# Patient Record
Sex: Female | Born: 1935 | Race: White | Hispanic: No | Marital: Married | Smoking: Never smoker
Health system: Southern US, Community
[De-identification: ages and names within clinical notes are randomized; demographics above are authoritative.]

## PROBLEM LIST (undated history)

## (undated) DIAGNOSIS — J449 Chronic obstructive pulmonary disease, unspecified: Secondary | ICD-10-CM

## (undated) DIAGNOSIS — F0390 Unspecified dementia without behavioral disturbance: Secondary | ICD-10-CM

## (undated) DIAGNOSIS — C801 Malignant (primary) neoplasm, unspecified: Secondary | ICD-10-CM

## (undated) HISTORY — PX: MASTECTOMY: SHX3

## (undated) HISTORY — PX: SHOULDER SURGERY: SHX246

---

## 1998-04-26 ENCOUNTER — Inpatient Hospital Stay (HOSPITAL_COMMUNITY): Admission: EM | Admit: 1998-04-26 | Discharge: 1998-04-29 | Payer: Self-pay | Admitting: Orthopedic Surgery

## 1998-04-26 ENCOUNTER — Encounter: Payer: Self-pay | Admitting: Orthopedic Surgery

## 2015-09-07 ENCOUNTER — Inpatient Hospital Stay
Admission: EM | Admit: 2015-09-07 | Discharge: 2015-09-10 | DRG: 871 | Disposition: A | Discharge status: Home / Self Care | Payer: MEDICARE | Attending: Internal Medicine | Admitting: Internal Medicine

## 2015-09-07 ENCOUNTER — Emergency Department: Payer: MEDICARE

## 2015-09-07 ENCOUNTER — Other Ambulatory Visit: Payer: Self-pay

## 2015-09-07 ENCOUNTER — Encounter: Payer: Self-pay | Admitting: Emergency Medicine

## 2015-09-07 DIAGNOSIS — J189 Pneumonia, unspecified organism: Secondary | ICD-10-CM

## 2015-09-07 DIAGNOSIS — Z79899 Other long term (current) drug therapy: Secondary | ICD-10-CM

## 2015-09-07 DIAGNOSIS — Z9889 Other specified postprocedural states: Secondary | ICD-10-CM | POA: Diagnosis not present

## 2015-09-07 DIAGNOSIS — Z885 Allergy status to narcotic agent status: Secondary | ICD-10-CM

## 2015-09-07 DIAGNOSIS — R059 Cough, unspecified: Secondary | ICD-10-CM

## 2015-09-07 DIAGNOSIS — Z853 Personal history of malignant neoplasm of breast: Secondary | ICD-10-CM

## 2015-09-07 DIAGNOSIS — J9621 Acute and chronic respiratory failure with hypoxia: Secondary | ICD-10-CM | POA: Diagnosis present

## 2015-09-07 DIAGNOSIS — Z66 Do not resuscitate: Secondary | ICD-10-CM | POA: Diagnosis present

## 2015-09-07 DIAGNOSIS — F0390 Unspecified dementia without behavioral disturbance: Secondary | ICD-10-CM | POA: Diagnosis present

## 2015-09-07 DIAGNOSIS — Z888 Allergy status to other drugs, medicaments and biological substances status: Secondary | ICD-10-CM

## 2015-09-07 DIAGNOSIS — R0902 Hypoxemia: Secondary | ICD-10-CM

## 2015-09-07 DIAGNOSIS — A419 Sepsis, unspecified organism: Secondary | ICD-10-CM | POA: Diagnosis not present

## 2015-09-07 DIAGNOSIS — R000 Tachycardia, unspecified: Secondary | ICD-10-CM | POA: Diagnosis present

## 2015-09-07 DIAGNOSIS — J440 Chronic obstructive pulmonary disease with acute lower respiratory infection: Secondary | ICD-10-CM | POA: Diagnosis present

## 2015-09-07 DIAGNOSIS — Z8249 Family history of ischemic heart disease and other diseases of the circulatory system: Secondary | ICD-10-CM

## 2015-09-07 DIAGNOSIS — Z9010 Acquired absence of unspecified breast and nipple: Secondary | ICD-10-CM

## 2015-09-07 DIAGNOSIS — R05 Cough: Secondary | ICD-10-CM

## 2015-09-07 HISTORY — DX: Unspecified dementia, unspecified severity, without behavioral disturbance, psychotic disturbance, mood disturbance, and anxiety: F03.90

## 2015-09-07 HISTORY — DX: Chronic obstructive pulmonary disease, unspecified: J44.9

## 2015-09-07 HISTORY — DX: Malignant (primary) neoplasm, unspecified: C80.1

## 2015-09-07 LAB — HCG, QUANTITATIVE, PREGNANCY

## 2015-09-07 LAB — URINALYSIS COMPLETE WITH MICROSCOPIC (ARMC ONLY)
BILIRUBIN URINE: NEGATIVE
Bacteria, UA: NONE SEEN
GLUCOSE, UA: NEGATIVE mg/dL
HGB URINE DIPSTICK: NEGATIVE
KETONES UR: NEGATIVE mg/dL
LEUKOCYTES UA: NEGATIVE
Nitrite: NEGATIVE
Protein, ur: NEGATIVE mg/dL
Specific Gravity, Urine: 1.018 (ref 1.005–1.030)
pH: 6 (ref 5.0–8.0)

## 2015-09-07 LAB — COMPREHENSIVE METABOLIC PANEL
ALK PHOS: 64 U/L (ref 38–126)
ALT: 27 U/L (ref 14–54)
AST: 40 U/L (ref 15–41)
Albumin: 3.5 g/dL (ref 3.5–5.0)
Anion gap: 8 (ref 5–15)
BUN: 14 mg/dL (ref 6–20)
CHLORIDE: 96 mmol/L — AB (ref 101–111)
CO2: 30 mmol/L (ref 22–32)
CREATININE: 0.84 mg/dL (ref 0.44–1.00)
Calcium: 9.6 mg/dL (ref 8.9–10.3)
GFR calc Af Amer: >60 mL/min (ref >60)
GFR, EST NON AFRICAN AMERICAN: 58 mL/min — AB (ref >60)
Glucose, Bld: 122 mg/dL — ABNORMAL HIGH (ref 65–99)
Potassium: 4.1 mmol/L (ref 3.5–5.1)
Sodium: 134 mmol/L — ABNORMAL LOW (ref 135–145)
Total Bilirubin: 1.1 mg/dL (ref 0.3–1.2)
Total Protein: 7.6 g/dL (ref 6.5–8.1)

## 2015-09-07 LAB — CBC WITH DIFFERENTIAL/PLATELET
BASOS ABS: 0.1 10*3/uL (ref 0–0.1)
EOS ABS: 0 10*3/uL (ref 0–0.7)
Eosinophils Relative: 0 %
HCT: 39.5 % (ref 35.0–47.0)
HEMOGLOBIN: 13.2 g/dL (ref 12.0–16.0)
Lymphocytes Relative: 8 %
Lymphs Abs: 1.2 10*3/uL (ref 1.0–3.6)
MCH: 30.6 pg (ref 26.0–34.0)
MCHC: 33.4 g/dL (ref 32.0–36.0)
MCV: 91.9 fL (ref 80.0–100.0)
MONO ABS: 1.2 10*3/uL — AB (ref 0.2–0.9)
NEUTROS ABS: 12 10*3/uL — AB (ref 1.4–6.5)
PLATELETS: 197 10*3/uL (ref 150–440)
RBC: 4.3 MIL/uL (ref 3.80–5.20)
RDW: 14.6 % — AB (ref 11.5–14.5)
WBC: 14.5 10*3/uL — ABNORMAL HIGH (ref 3.6–11.0)

## 2015-09-07 LAB — LACTIC ACID, PLASMA: LACTIC ACID, VENOUS: 1.2 mmol/L (ref 0.5–2.0)

## 2015-09-07 MED ORDER — ENOXAPARIN SODIUM 40 MG/0.4ML ~~LOC~~ SOLN
40.0000 mg | SUBCUTANEOUS | Status: DC
  Administered 2015-09-07 – 2015-09-09 (×3): 40 mg via SUBCUTANEOUS
  Filled 2015-09-07 (×3): qty 0.4

## 2015-09-07 MED ORDER — CEFTRIAXONE SODIUM 1 G IJ SOLR
1.0000 g | Freq: Once | INTRAMUSCULAR | Status: AC
  Administered 2015-09-07: 1 g via INTRAVENOUS
  Filled 2015-09-07: qty 10

## 2015-09-07 MED ORDER — PANTOPRAZOLE SODIUM 40 MG PO TBEC
40.0000 mg | DELAYED_RELEASE_TABLET | ORAL | Status: DC
  Administered 2015-09-08 – 2015-09-09 (×2): 40 mg via ORAL
  Filled 2015-09-07 (×2): qty 1

## 2015-09-07 MED ORDER — ALBUTEROL SULFATE (2.5 MG/3ML) 0.083% IN NEBU
2.5000 mg | INHALATION_SOLUTION | Freq: Four times a day (QID) | RESPIRATORY_TRACT | Status: DC
Start: 2015-09-07 — End: 2015-09-10
  Administered 2015-09-07 – 2015-09-09 (×9): 2.5 mg via RESPIRATORY_TRACT
  Filled 2015-09-07 (×11): qty 3

## 2015-09-07 MED ORDER — SODIUM CHLORIDE 0.9 % IV SOLN
INTRAVENOUS | Status: DC
  Administered 2015-09-07 – 2015-09-08 (×2): via INTRAVENOUS

## 2015-09-07 MED ORDER — ALBUTEROL SULFATE (2.5 MG/3ML) 0.083% IN NEBU
2.5000 mg | INHALATION_SOLUTION | RESPIRATORY_TRACT | Status: DC | PRN
  Administered 2015-09-07 – 2015-09-09 (×5): 2.5 mg via RESPIRATORY_TRACT
  Filled 2015-09-07 (×6): qty 3

## 2015-09-07 MED ORDER — TRAMADOL HCL 50 MG PO TABS
25.0000 mg | ORAL_TABLET | Freq: Every evening | ORAL | Status: DC | PRN
  Administered 2015-09-07: 25 mg via ORAL
  Filled 2015-09-07: qty 1

## 2015-09-07 MED ORDER — ONDANSETRON HCL 4 MG PO TABS: 4.0000 mg | ORAL_TABLET | Freq: Three times a day (TID) | ORAL | Status: DC | PRN

## 2015-09-07 MED ORDER — DEXTROSE 5 % IV SOLN
500.0000 mg | Freq: Once | INTRAVENOUS | Status: AC
  Administered 2015-09-07: 500 mg via INTRAVENOUS
  Filled 2015-09-07: qty 500

## 2015-09-07 MED ORDER — CEFTRIAXONE SODIUM 1 G IJ SOLR
1.0000 g | INTRAMUSCULAR | Status: DC
  Administered 2015-09-08 – 2015-09-09 (×2): 1 g via INTRAVENOUS
  Filled 2015-09-07 (×3): qty 10

## 2015-09-07 MED ORDER — ACETAMINOPHEN 500 MG PO TABS
1000.0000 mg | ORAL_TABLET | Freq: Four times a day (QID) | ORAL | Status: DC | PRN
  Administered 2015-09-08 – 2015-09-09 (×3): 1000 mg via ORAL
  Filled 2015-09-07 (×3): qty 2

## 2015-09-07 MED ORDER — SODIUM CHLORIDE 0.9 % IV BOLUS (SEPSIS)
1000.0000 mL | Freq: Once | INTRAVENOUS | Status: AC
  Administered 2015-09-07: 1000 mL via INTRAVENOUS

## 2015-09-07 MED ORDER — HYDROXYZINE HCL 10 MG PO TABS
25.0000 mg | ORAL_TABLET | ORAL | Status: DC
  Administered 2015-09-07 – 2015-09-09 (×12): 25 mg via ORAL
  Filled 2015-09-07: qty 1
  Filled 2015-09-07 (×11): qty 3

## 2015-09-07 MED ORDER — DEXTROSE 5 % IV SOLN
500.0000 mg | INTRAVENOUS | Status: DC
  Administered 2015-09-08 – 2015-09-09 (×2): 500 mg via INTRAVENOUS
  Filled 2015-09-07 (×3): qty 500

## 2015-09-07 MED ORDER — METHENAMINE MANDELATE 1 G PO TABS
1000.0000 mg | ORAL_TABLET | Freq: Every day | ORAL | Status: DC
  Administered 2015-09-07 – 2015-09-09 (×3): 1000 mg via ORAL
  Filled 2015-09-07 (×5): qty 1

## 2015-09-07 MED ORDER — ALPRAZOLAM 0.5 MG PO TABS
0.5000 mg | ORAL_TABLET | Freq: Every day | ORAL | Status: DC
  Administered 2015-09-07 – 2015-09-09 (×2): 0.5 mg via ORAL
  Filled 2015-09-07 (×2): qty 1

## 2015-09-07 NOTE — ED Notes (Signed)
[facility] radiology called to [facility] about chest xray; States that they cannot send over fax of impression. Unsure why it is not showing up in computer.  Staff read impression to this RN,  Impression: Bibasilar atelectasis possible pneumonia. Chronic interstitial lung disease Read by Dr Claudie Revering

## 2015-09-07 NOTE — ED Notes (Signed)
Patient presents to the ED with fever and increased weakness.  Patient has had cold symptoms since Friday per patient's daughter.  Patient normally can get up to bedside commode but patient is unable to move herself this morning.  Patient is alert and able to answer some questions.

## 2015-09-07 NOTE — ED Notes (Signed)
Spoke with MD regarding ordered fluid bolus. Inquired if more NS was needed than 109ml per patient weight of 54kg. No further orders from MD at this time.

## 2015-09-07 NOTE — ED Notes (Signed)
Lab called to inquire if patient can be credited hcg lab test since it was entered in error per protocol for another Therapist, sports.

## 2015-09-07 NOTE — ED Provider Notes (Signed)
[facility] Emergency Department Provider Note   ____________________________________________  Time seen: Approximately 8:29 AM  I have reviewed the triage vital signs and the nursing notes.   HISTORY  Chief Complaint Weakness; Code Sepsis; and Shortness of Breath    HPI Susan Alexander is a 82 y.o. female who complains of green sputum and productive cough for 3 days. Today patient began running a fever and was very weak and had trouble getting out of bed by herself. Patient has a history of mild COPD but no hypertension diabetes congestive heart failure or heart disease. Patient lives at home has 24-hour care at home from the family and caregivers. Patient has no other associated symptoms and nothing seems to make her symptoms any better or worse   Past Medical History  Diagnosis Date  . COPD (chronic obstructive pulmonary disease) ([place])   . Dementia   . Cancer [facility])     breast cancer    There are no active problems to display for this patient.   Past Surgical History  Procedure Laterality Date  . Mastectomy Left   . Shoulder surgery      Current Outpatient Rx  Name  Route  Sig  Dispense  Refill  . acetaminophen (TYLENOL) 500 MG tablet   Oral   Take 1,000 mg by mouth every 6 (six) hours as needed for mild pain or moderate pain.         Marland Kitchen albuterol (PROVENTIL) (2.5 MG/3ML) 0.083% nebulizer solution   Nebulization   Take 2.5 mg by nebulization every 4 (four) hours as needed for wheezing or shortness of breath.         . ALPRAZolam (XANAX) 0.5 MG tablet   Oral   Take 0.5 mg by mouth at bedtime.         . hydrOXYzine (ATARAX/VISTARIL) 25 MG tablet   Oral   Take 25 mg by mouth every 4 (four) hours.          . methenamine (MANDELAMINE) 1 g tablet   Oral   Take 1,000 mg by mouth daily.         . ondansetron (ZOFRAN) 4 MG tablet   Oral   Take 4 mg by mouth every 8 (eight) hours as needed for nausea or vomiting.         .  pantoprazole (PROTONIX) 40 MG tablet   Oral   Take 40 mg by mouth every morning.         . traMADol (ULTRAM) 50 MG tablet   Oral   Take 25 mg by mouth at bedtime as needed for moderate pain or severe pain.           Allergies Macrodantin and Morphine and related  No family history on file.  Social History Social History  Substance Use Topics  . Smoking status: Never Smoker   . Smokeless tobacco: None  . Alcohol Use: No    Review of Systems Constitutional: fever Eyes: No visual changes. ENT: No sore throat. Cardiovascular: Denies chest pain. Respiratory:shortness of breath. Gastrointestinal: No abdominal pain.  No nausea, no vomiting.  No diarrhea.  No constipation. Genitourinary: Negative for dysuria. Musculoskeletal: Negative for back pain. Skin: Negative for rash. Neurological: Negative for headaches, focal weakness or numbness.  10-point ROS otherwise negative.  ____________________________________________   PHYSICAL EXAM:  VITAL SIGNS: ED Triage Vitals  Enc Vitals Group     BP 09/07/15 0804 100/55 mmHg     Pulse Rate 09/07/15 0804  88     Resp 09/07/15 0823 18     Temp 09/07/15 0804 100.5 F (38.1 C)     Temp Source 09/07/15 0804 Rectal     SpO2 09/07/15 0804 80 %     Weight 09/07/15 0804 114 lb (51.71 kg)     Height 09/07/15 0804 5\' 4"  (1.626 m)     Head Cir --      Peak Flow --      Pain Score --      Pain Loc --      Pain Edu? --      Excl. in [place]? --     Constitutional: Alert and oriented. Patient looks exhausted is laying in bed over to the side of her mouth is open and her face is lacking at this point. If you speak to her she sits up and looks much more normal but otherwise she looks exhausted. Eyes: Conjunctivae are normal. PERRL. EOMI. Head: Atraumatic. Nose: No congestion/rhinnorhea. Mouth/Throat: Mucous membranes are moist.  Oropharynx non-erythematous. Neck: No stridor.  Cardiovascular: Normal rate, regular rhythm. Grossly normal  heart sounds.  Good peripheral circulation. Respiratory: Normal respiratory effort.  No retractions. Lungs crackles especially in the right base Gastrointestinal: Soft and nontender. No distention. No abdominal bruits. No CVA tenderness. Musculoskeletal: No lower extremity tenderness nor edema.  No joint effusions. Neurologic:  Normal speech and language. No gross focal neurologic deficits are appreciated. Skin:  Skin is warm, dry and intact. No rash noted. Psychiatric: Mood and affect are normal. Speech and behavior are normal.  ____________________________________________   LABS (all labs ordered are listed, but only abnormal results are displayed)  Labs Reviewed  COMPREHENSIVE METABOLIC PANEL - Abnormal; Notable for the following:    Sodium 134 (*)    Chloride 96 (*)    Glucose, Bld 122 (*)    GFR calc non Af Amer 58 (*)    All other components within normal limits  CBC WITH DIFFERENTIAL/PLATELET - Abnormal; Notable for the following:    WBC 14.5 (*)    RDW 14.6 (*)    Neutro Abs 12.0 (*)    Monocytes Absolute 1.2 (*)    All other components within normal limits  URINALYSIS COMPLETEWITH MICROSCOPIC ([facility] ONLY) - Abnormal; Notable for the following:    Color, Urine YELLOW (*)    APPearance CLEAR (*)    Squamous Epithelial / LPF 0-5 (*)    All other components within normal limits  CULTURE, BLOOD (ROUTINE X 2)  CULTURE, BLOOD (ROUTINE X 2)  URINE CULTURE  HCG, QUANTITATIVE, PREGNANCY  LACTIC ACID, PLASMA   ____________________________________________  EKG  EKG read and interpreted by me shows sinus rhythm rate of 89 left axis left bundle-branch block no other changes are appreciated but no old EKGs are available. ____________________________________________  RADIOLOGY  Chest x-ray report is not coming up on the computer however the radiologist I am informed that it is atelectasis or infiltrate ____________________________________________   PROCEDURES  CRITICAL  CARE Performed by: Nena Polio   Total critical care time:15 minutes  Critical care time was exclusive of separately billable procedures and treating other patients.  Critical care was necessary to treat or prevent imminent or life-threatening deterioration.  Critical care was time spent personally by me on the following activities: development of treatment plan with patient and/or surrogate as well as nursing, discussions with consultants, evaluation of patient's response to treatment, examination of patient, obtaining history from patient or surrogate, ordering and performing treatments  and interventions, ordering and review of laboratory studies, ordering and review of radiographic studies, pulse oximetry and re-evaluation of patient's condition.  ____________________________________________   INITIAL IMPRESSION / ASSESSMENT AND PLAN / ED COURSE  Pertinent labs & imaging results that were available during my care of the patient were reviewed by me and considered in my medical decision making (see chart for details).   ____________________________________________   FINAL CLINICAL IMPRESSION(S) / ED DIAGNOSES  Final diagnoses:  Hypoxia  Community acquired pneumonia  Sepsis, due to unspecified organism [facility])      NEW MEDICATIONS STARTED DURING THIS VISIT:  New Prescriptions   No medications on file     Note:  This document was prepared using Dragon voice recognition software and may include unintentional dictation errors.    Nena Polio, MD 09/07/15 1520

## 2015-09-07 NOTE — ED Notes (Signed)
Family at bedside. Denies any CHF diagnosis or sx.

## 2015-09-07 NOTE — ED Notes (Signed)
MD at bedside. 

## 2015-09-07 NOTE — ED Notes (Addendum)
Xray department called to inquire about chest xray result, told to call [facility] Radiology for questions.

## 2015-09-07 NOTE — ED Notes (Signed)
Pt updated on xray results; informed that patient will admitted and admission MD will be in to see patient and family. Pt alert and oriented X4, active, cooperative, pt in NAD. RR even and unlabored, color WNL.

## 2015-09-07 NOTE — ED Notes (Signed)
Report to Alicia, RN.

## 2015-09-07 NOTE — ED Notes (Signed)
Pt placed on 4L [place] and is now at 99% on oxygen. Pt does not usually wear oxygen at home. Pt unable to stand due to weakness; this is acute since last night. Pt c/o cough X 3 days. Pt daughter has been treating with OTC mucinex.

## 2015-09-07 NOTE — H&P (Signed)
[facility] [place] NAME: Susan Alexander    MR#:  [id number]  DATE OF BIRTH:  [birth year]  DATE OF ADMISSION:  09/07/2015  PRIMARY CARE PHYSICIAN: Rusty Aus, MD   REQUESTING/REFERRING PHYSICIAN: dr Cinda Quest  CHIEF COMPLAINT:  SOB, cough and fever  HISTORY OF PRESENT ILLNESS:  Susan Alexander  is a 82 y.o. female with a known history of Dementia, history of breast cancer and history of COPD comes into the emergency room accompanied by her daughter with the above chief complaint. Patient is very weak fatigued and unable to give much review of systems or history. Most of the information is obtained from daughter who is at bedside. Patient has been having cough with productive green thick mucoid sputum for last couple days was found to have low-grade fever last night and appears very weak short of breath. Daughter gave some breathing treatment however patient wasn't able to do her routine chores by herself which she normally does. She was brought to the emergency room was found to have pneumonia bilateral more on the right than left.  Patient is being admitted with sepsis and she presented with fever of 100.2 tachycardia elevated white count and chest x-ray suggestive of pneumonia.  She received IV Rocephin and Zithromax in the emergency room.  PAST MEDICAL HISTORY:   Past Medical History  Diagnosis Date  . COPD (chronic obstructive pulmonary disease) ([place])   . Dementia   . Cancer [facility])     breast cancer    PAST SURGICAL HISTOIRY:   Past Surgical History  Procedure Laterality Date  . Mastectomy Left   . Shoulder surgery      SOCIAL HISTORY:   Social History  Substance Use Topics  . Smoking status: Never Smoker   . Smokeless tobacco: Not on file  . Alcohol Use: No    FAMILY HISTORY:  HTN  DRUG ALLERGIES:   Allergies  Allergen Reactions  . Macrodantin [Nitrofurantoin] Other (See Comments)    Unknown reaction.  .  Morphine And Related Rash    REVIEW OF SYSTEMS:  Review of Systems  Unable to perform ROS: acuity of condition     MEDICATIONS AT HOME:   Prior to Admission medications   Medication Sig Start Date End Date Taking? Authorizing Provider  acetaminophen (TYLENOL) 500 MG tablet Take 1,000 mg by mouth every 6 (six) hours as needed for mild pain or moderate pain.   Yes Historical Provider, MD  albuterol (PROVENTIL) (2.5 MG/3ML) 0.083% nebulizer solution Take 2.5 mg by nebulization every 4 (four) hours as needed for wheezing or shortness of breath.   Yes Historical Provider, MD  ALPRAZolam Duanne Moron) 0.5 MG tablet Take 0.5 mg by mouth at bedtime.   Yes Historical Provider, MD  hydrOXYzine (ATARAX/VISTARIL) 25 MG tablet Take 25 mg by mouth every 4 (four) hours.    Yes Historical Provider, MD  methenamine (MANDELAMINE) 1 g tablet Take 1,000 mg by mouth daily.   Yes Historical Provider, MD  ondansetron (ZOFRAN) 4 MG tablet Take 4 mg by mouth every 8 (eight) hours as needed for nausea or vomiting.   Yes Historical Provider, MD  pantoprazole (PROTONIX) 40 MG tablet Take 40 mg by mouth every morning.   Yes Historical Provider, MD  traMADol (ULTRAM) 50 MG tablet Take 25 mg by mouth at bedtime as needed for moderate pain or severe pain.   Yes Historical Provider, MD      VITAL SIGNS:  Blood pressure  112/89, pulse 84, temperature 100.5 F (38.1 C), temperature source Rectal, resp. rate 24, height 5\' 4"  (1.626 m), weight 54.2 kg (119 lb 7.8 oz), SpO2 98 %.  PHYSICAL EXAMINATION:  GENERAL:  82 y.o.-year-old patient lying in the bed with no acute distress. weak EYES: Pupils equal, round, reactive to light and accommodation. No scleral icterus. Extraocular muscles intact.  HEENT: Head atraumatic, normocephalic. Oropharynx and nasopharynx clear.  NECK:  Supple, no jugular venous distention. No thyroid enlargement, no tenderness.  LUNGS: Normal breath sounds bilaterally, no wheezing, bilateral rales,no rhonchi  or crepitation. No use of accessory muscles of respiration.  CARDIOVASCULAR: S1, S2 normal. No murmurs, rubs, or gallops. tachycardia ABDOMEN: Soft, nontender, nondistended. Bowel sounds present. No organomegaly or mass.  EXTREMITIES: No pedal edema, cyanosis, or clubbing.  NEUROLOGIC: Cranial nerves II through XII are intact. Muscle strength 4/5 in all extremities. Sensation intact. Gait not checked. Very weak PSYCHIATRIC: The patient is alert but confused SKIN: No obvious rash, lesion, or ulcer.   LABORATORY PANEL:   CBC  Recent Labs Lab 09/07/15 0807  WBC 14.5*  HGB 13.2  HCT 39.5  PLT 197   ------------------------------------------------------------------------------------------------------------------  Chemistries   Recent Labs Lab 09/07/15 0807  NA 134*  K 4.1  CL 96*  CO2 30  GLUCOSE 122*  BUN 14  CREATININE 0.84  CALCIUM 9.6  AST 40  ALT 27  ALKPHOS 64  BILITOT 1.1   IMPRESSION AND PLAN:   Susan Alexander  is a 82 y.o. female with a known history of Dementia, history of breast cancer and history of COPD comes into the emergency room accompanied by her daughter with the above chief complaint. Patient is very weak fatigued and unable to give much review of systems or history. Most of the information is obtained from daughter who is at bedside. Patient has been having cough with productive green thick mucoid sputum for last couple days was found to have low-grade fever last night and appears very weak short of breath.  1. sepsis due to bilateral pneumonia right more than left, community-acquired -Admit to medical floor -IV Rocephin and Zithromax. -Follow blood culture and WBC count -Incentive spirometer and breathing treatment around the clock  2. Leukocytosis due to 1  3. Mild dementia -Patient has 24x 7 caregivers at home  4. Gen weakness _PT to see pt -Cm for d/c planing  All the records are reviewed and case discussed with ED provider. Management  plans discussed with the patient, family and they are in agreement.  CODE STATUS: DNR (per daughter 30)  TOTAL TIME TAKING CARE OF THIS PATIENT: 50 minutes.    Dutch Ing M.D on 09/07/2015 at 12:05 PM  Between 7am to 6pm - Pager - [phone]  After 6pm go to [email or web address] - password [email or web address] [place]  Office  [phone]  CC: Primary care physician; Rusty Aus, MD

## 2015-09-08 LAB — URINE CULTURE: CULTURE: NO GROWTH

## 2015-09-08 MED ORDER — MORPHINE SULFATE (PF) 2 MG/ML IV SOLN
0.5000 mg | Freq: Once | INTRAVENOUS | Status: AC
  Administered 2015-09-08: 0.5 mg via INTRAVENOUS
  Filled 2015-09-08: qty 1

## 2015-09-08 MED ORDER — TRAMADOL HCL 50 MG PO TABS
25.0000 mg | ORAL_TABLET | Freq: Once | ORAL | Status: AC
  Administered 2015-09-08: 07:00:00 25 mg via ORAL
  Filled 2015-09-08: qty 1

## 2015-09-08 MED ORDER — LORAZEPAM 2 MG/ML IJ SOLN
0.5000 mg | Freq: Once | INTRAMUSCULAR | Status: AC
  Administered 2015-09-08: 0.5 mg via INTRAVENOUS
  Filled 2015-09-08: qty 1

## 2015-09-08 MED ORDER — METOPROLOL TARTRATE 5 MG/5ML IV SOLN
5.0000 mg | Freq: Once | INTRAVENOUS | Status: AC
  Administered 2015-09-08: 5 mg via INTRAVENOUS
  Filled 2015-09-08: qty 5

## 2015-09-08 MED ORDER — TRAMADOL HCL 50 MG PO TABS
25.0000 mg | ORAL_TABLET | Freq: Three times a day (TID) | ORAL | Status: DC | PRN
  Administered 2015-09-09: 25 mg via ORAL
  Filled 2015-09-08: qty 1

## 2015-09-08 NOTE — Evaluation (Signed)
Physical Therapy Evaluation Patient Details Name: Susan Alexander MRN: [id number] DOB: 1918-04-19 Today's Date: 09/08/2015   History of Present Illness  Patient is being admitted with sepsis and she presented with fever of 100.2 tachycardia elevated white count and chest x-ray suggestive of pneumonia.  Clinical Impression  Patient presents with decreased UE and LE strength, decreased bed mobility with max assist x 1 supine to sit and max assist x 2 sit to supine. She needs max assist x 2 for sit to stand with RW and is able to stand with mod assist x 2 for less than 1 minute. She is not able to take any steps. Patient will benefit from skilled PT to return to her PLOF.     Follow Up Recommendations SNF    Equipment Recommendations  Rolling walker with 5" wheels    Recommendations for Other Services       Precautions / Restrictions Precautions Precautions: Fall Restrictions Weight Bearing Restrictions: No      Mobility  Bed Mobility Overal bed mobility: +2 for physical assistance                Transfers Overall transfer level: Needs assistance Equipment used: Rolling walker (2 wheeled) Transfers: Sit to/from Stand Sit to Stand: Max assist;+2 physical assistance            Ambulation/Gait Ambulation/Gait assistance:  (unable)              Stairs            Wheelchair Mobility    Modified Rankin (Stroke Patients Only)       Balance                                             Pertinent Vitals/Pain Pain Assessment: No/denies pain    Home Living Family/patient expects to be discharged to:: Private residence Living Arrangements: Children Available Help at Discharge: Family Type of Home: House Home Access: Ramped entrance     Home Layout: One level Home Equipment: Environmental consultant - 2 wheels;Bedside commode;Shower seat;Hospital bed;Wheelchair - manual      Prior Function Level of Independence: Needs assistance   Gait /  Transfers Assistance Needed: assist for transfers and gait  ADL's / Homemaking Assistance Needed: assist for ADL's         Hand Dominance   Dominant Hand: Right    Extremity/Trunk Assessment   Upper Extremity Assessment: Generalized weakness;LUE deficits/detail       LUE Deficits / Details: 2/5 strength and 0-60 deg ROM left shoulder   Lower Extremity Assessment: Generalized weakness      Cervical / Trunk Assessment: Kyphotic  Communication      Cognition Arousal/Alertness: Awake/alert Behavior During Therapy: WFL for tasks assessed/performed Overall Cognitive Status: Within Functional Limits for tasks assessed                      General Comments      Exercises        Assessment/Plan    PT Assessment Patient needs continued PT services  PT Diagnosis Difficulty walking   PT Problem List Decreased strength;Decreased range of motion;Decreased activity tolerance;Decreased mobility  PT Treatment Interventions Gait training;Functional mobility training;Therapeutic activities;Therapeutic exercise   PT Goals (Current goals can be found in the Care Plan section) Acute Rehab PT Goals Patient Stated Goal: no goals stated Time For  Goal Achievement: 09/22/15 Potential to Achieve Goals: Fair    Frequency Min 2X/week   Barriers to discharge        Co-evaluation               End of Session Equipment Utilized During Treatment: Gait belt Activity Tolerance: Patient limited by fatigue;Patient limited by lethargy Patient left: in bed;with bed alarm set           Time: 1015-1045 PT Time Calculation (min) (ACUTE ONLY): 30 min   Charges:   PT Evaluation $PT Eval Moderate Complexity: 1 Procedure PT Treatments $Therapeutic Activity: 8-22 mins   PT G Codes:      Alanson Puls, PT, DPT  [place], Minette [place] S 09/08/2015, 11:36 AM

## 2015-09-08 NOTE — Progress Notes (Signed)
Pt lethargic since IV ativan, Morphine effective, less labored, resting quietly and comfortably daughter at bedside

## 2015-09-08 NOTE — Progress Notes (Signed)
[facility] at [place] NAME: Susan Alexander    MR#:  [id number]  DATE OF BIRTH:  09-30-1918  SUBJECTIVE:  CHIEF COMPLAINT:   Chief Complaint  Patient presents with  . Weakness  . Code Sepsis  . Shortness of Breath     Brought with yellow sputum.   As per daughter- slight better today.   Have pneumonia, and she is requiring home oxygen.  REVIEW OF SYSTEMS:  CONSTITUTIONAL: No fever, fatigue or weakness.  EYES: No blurred or double vision.  EARS, NOSE, AND THROAT: No tinnitus or ear pain.  RESPIRATORY: positive for cough, shortness of breath, no wheezing or hemoptysis.  CARDIOVASCULAR: No chest pain, orthopnea, edema.  GASTROINTESTINAL: No nausea, vomiting, diarrhea or abdominal pain.  GENITOURINARY: No dysuria, hematuria.  ENDOCRINE: No polyuria, nocturia,  HEMATOLOGY: No anemia, easy bruising or bleeding SKIN: No rash or lesion. MUSCULOSKELETAL: No joint pain or arthritis.   NEUROLOGIC: No tingling, numbness, weakness.  PSYCHIATRY: No anxiety or depression.   ROS  DRUG ALLERGIES:   Allergies  Allergen Reactions  . Macrodantin [Nitrofurantoin] Other (See Comments)    Unknown reaction.  . Morphine And Related Rash    VITALS:  Blood pressure 139/48, pulse 92, temperature 98.2 F (36.8 C), temperature source Oral, resp. rate 18, height 5\' 2"  (1.575 m), weight 58.741 kg (129 lb 8 oz), SpO2 99 %.  PHYSICAL EXAMINATION:  GENERAL:  82 y.o.-year-old patient lying in the bed with no acute distress.  EYES: Pupils equal, round, reactive to light and accommodation. No scleral icterus. Extraocular muscles intact.  HEENT: Head atraumatic, normocephalic. Oropharynx and nasopharynx clear.  NECK:  Supple, no jugular venous distention. No thyroid enlargement, no tenderness.  LUNGS: Normal breath sounds bilaterally, no wheezing, some crepitation. No use of accessory muscles of respiration.  CARDIOVASCULAR: S1, S2 normal. systolic murmurs, no  rubs, or gallops.  ABDOMEN: Soft, nontender, nondistended. Bowel sounds present. No organomegaly or mass.  EXTREMITIES: No pedal edema, cyanosis, or clubbing.  NEUROLOGIC: Cranial nerves II through XII are intact. Muscle strength 5/5 in all extremities. Sensation intact. Gait not checked.  PSYCHIATRIC: The patient is alert and oriented x 3.  SKIN: No obvious rash, lesion, or ulcer.   Physical Exam LABORATORY PANEL:   CBC  Recent Labs Lab 09/07/15 0807  WBC 14.5*  HGB 13.2  HCT 39.5  PLT 197   ------------------------------------------------------------------------------------------------------------------  Chemistries   Recent Labs Lab 09/07/15 0807  NA 134*  K 4.1  CL 96*  CO2 30  GLUCOSE 122*  BUN 14  CREATININE 0.84  CALCIUM 9.6  AST 40  ALT 27  ALKPHOS 64  BILITOT 1.1   ------------------------------------------------------------------------------------------------------------------  Cardiac Enzymes No results for input(s): TROPONINI in the last 168 hours. ------------------------------------------------------------------------------------------------------------------  RADIOLOGY:  No results found.  ASSESSMENT AND PLAN:   Active Problems:   Sepsis ([place])   Susan Alexander is a 82 y.o. female with a known history of Dementia, history of breast cancer and history of COPD comes into the emergency room accompanied by her daughter with the above chief complaint. Patient is very weak fatigued and unable to give much review of systems or history. Most of the information is obtained from daughter who is at bedside. Patient has been having cough with productive green thick mucoid sputum for last couple days was found to have low-grade fever last night and appears very weak short of breath.  1. sepsis due to bilateral pneumonia right more than left, community-acquired -IV  Rocephin and Zithromax. -Follow blood culture and WBC count -Incentive spirometer and  breathing treatment around the clock - suplemental oxygen as needed.  2. Leukocytosis due to 1  3. Mild dementia -Patient has 24x 7 caregivers at home  4. Gen weakness _PT to see pt -Cm for d/c planing   All the records are reviewed and case discussed with Care Management/Social Workerr. Management plans discussed with the patient, family and they are in agreement.  CODE STATUS: DNR  TOTAL TIME TAKING CARE OF THIS PATIENT: 35 minutes.   Discussed with her daughter in room.  POSSIBLE D/C IN 1-2 DAYS, DEPENDING ON CLINICAL CONDITION.   Susan Alexander M.D on 09/08/2015   Between 7am to 6pm - Pager - [phone]  After 6pm go to [email or web address] - password [email or web address] [facility]  Office  [phone]  CC: Primary care physician; Rusty Aus, MD  Note: This dictation was prepared with Dragon dictation along with smaller phrase technology. Any transcriptional errors that result from this process are unintentional.

## 2015-09-08 NOTE — Care Management (Signed)
Admitted to [facility] with the diagnosis of sepsis. Lives with daughter, Jones Broom [place] [phone]) since July 2015. [place] from [place]. Multiple falls when living in [place]. Assisted falls since she has been living her her daughter. Skilled nursing x 3 in the past. SNF x 2 at [facility] in [place]. No hom oxygen. Rolling walker, gait belt, hospital bed, bedside commode x 2, shower chair, and royator in the home. Sitters during the morning and at night. Someone is Ms. Bauer 24/7 all the time. Needs help with bath and dressing.  Self feeds. Gets medications at [facility] on [address]. Physical therapy evaluation pending Shelbie Ammons RN MSN CCM Care Management [phone]

## 2015-09-08 NOTE — Progress Notes (Signed)
MD made aware that pt HR improved now NSR BBB HR 100, pt now relaxed and resting but remains slightly labored, new order for morphine placed for respiratory rate, daughter remains at bedside and agrees with all plans and orders thus far

## 2015-09-08 NOTE — Progress Notes (Signed)
Called into room by private sitter pt very restless, moving all over bed pulling at IV and telemetry monitor, Telemetry monitoring called and reported that pt HR up to 160, MD made aware that pt HR up to 160 pt extremely restless flailing over bed, increased respiratory rate labored breathing, sitter and daughter states that pt has had episodes of anxiety/panic attacks like this before, ativan and metoprolol ordered one time dose and order to discontinue fluids, lungs clear and pt with upper airway wheezes, neb tx given, RT at bedside

## 2015-09-09 ENCOUNTER — Inpatient Hospital Stay: Payer: MEDICARE

## 2015-09-09 LAB — BASIC METABOLIC PANEL
ANION GAP: 5 (ref 5–15)
BUN: 15 mg/dL (ref 6–20)
CALCIUM: 9.1 mg/dL (ref 8.9–10.3)
CO2: 27 mmol/L (ref 22–32)
Chloride: 100 mmol/L — ABNORMAL LOW (ref 101–111)
Creatinine, Ser: 0.69 mg/dL (ref 0.44–1.00)
Glucose, Bld: 130 mg/dL — ABNORMAL HIGH (ref 65–99)
Potassium: 4.6 mmol/L (ref 3.5–5.1)
Sodium: 132 mmol/L — ABNORMAL LOW (ref 135–145)

## 2015-09-09 LAB — CBC
HEMATOCRIT: 33.3 % — AB (ref 35.0–47.0)
HEMOGLOBIN: 11.1 g/dL — AB (ref 12.0–16.0)
MCH: 30.9 pg (ref 26.0–34.0)
MCHC: 33.4 g/dL (ref 32.0–36.0)
MCV: 92.4 fL (ref 80.0–100.0)
Platelets: 154 10*3/uL (ref 150–440)
RBC: 3.6 MIL/uL — AB (ref 3.80–5.20)
RDW: 14.5 % (ref 11.5–14.5)
WBC: 11.2 10*3/uL — AB (ref 3.6–11.0)

## 2015-09-09 LAB — MRSA PCR SCREENING: MRSA by PCR: POSITIVE — AB

## 2015-09-09 MED ORDER — MORPHINE SULFATE (PF) 2 MG/ML IV SOLN
1.0000 mg | Freq: Once | INTRAVENOUS | Status: AC
Start: 2015-09-09 — End: 2015-09-09
  Administered 2015-09-09: 1 mg via INTRAVENOUS
  Filled 2015-09-09: qty 1

## 2015-09-09 MED ORDER — MORPHINE SULFATE (PF) 2 MG/ML IV SOLN
0.5000 mg | INTRAVENOUS | Status: DC | PRN
Start: 2015-09-09 — End: 2015-09-10
  Administered 2015-09-09: 0.5 mg via INTRAVENOUS
  Filled 2015-09-09: qty 1

## 2015-09-09 MED ORDER — MUPIROCIN 2 % EX OINT
1.0000 "application " | TOPICAL_OINTMENT | Freq: Two times a day (BID) | CUTANEOUS | Status: DC
  Administered 2015-09-10 (×2): 1 via NASAL
  Filled 2015-09-09: qty 22

## 2015-09-09 MED ORDER — BUDESONIDE 0.25 MG/2ML IN SUSP: 0.2500 mg | Freq: Two times a day (BID) | RESPIRATORY_TRACT | Status: DC

## 2015-09-09 MED ORDER — LORAZEPAM 2 MG/ML IJ SOLN
0.5000 mg | Freq: Once | INTRAMUSCULAR | Status: AC
  Administered 2015-09-09: 0.5 mg via INTRAVENOUS

## 2015-09-09 MED ORDER — TRAMADOL HCL 50 MG PO TABS: 50.0000 mg | ORAL_TABLET | Freq: Three times a day (TID) | ORAL | Status: DC | PRN

## 2015-09-09 MED ORDER — CHLORHEXIDINE GLUCONATE CLOTH 2 % EX PADS
6.0000 | MEDICATED_PAD | Freq: Every day | CUTANEOUS | Status: DC
  Administered 2015-09-10: 06:00:00 6 via TOPICAL

## 2015-09-09 MED ORDER — LORAZEPAM 2 MG/ML IJ SOLN
INTRAMUSCULAR | Status: AC
  Filled 2015-09-09: qty 1

## 2015-09-09 MED ORDER — TRAMADOL HCL 50 MG PO TABS
25.0000 mg | ORAL_TABLET | Freq: Once | ORAL | Status: AC
  Administered 2015-09-09: 25 mg via ORAL
  Filled 2015-09-09: qty 1

## 2015-09-09 MED ORDER — VANCOMYCIN HCL IN DEXTROSE 750-5 MG/150ML-% IV SOLN
750.0000 mg | Freq: Once | INTRAVENOUS | Status: AC
  Administered 2015-09-09: 13:00:00 750 mg via INTRAVENOUS
  Filled 2015-09-09: qty 150

## 2015-09-09 MED ORDER — VANCOMYCIN HCL IN DEXTROSE 750-5 MG/150ML-% IV SOLN
750.0000 mg | INTRAVENOUS | Status: DC
  Administered 2015-09-09: 20:00:00 750 mg via INTRAVENOUS
  Filled 2015-09-09 (×2): qty 150

## 2015-09-09 MED ORDER — LORAZEPAM 2 MG/ML IJ SOLN
INTRAMUSCULAR | Status: AC
Start: 2015-09-09 — End: 2015-09-09
  Filled 2015-09-09: qty 1

## 2015-09-09 MED ORDER — PIPERACILLIN-TAZOBACTAM 3.375 G IVPB
3.3750 g | Freq: Three times a day (TID) | INTRAVENOUS | Status: DC
  Administered 2015-09-09 – 2015-09-10 (×3): 3.375 g via INTRAVENOUS
  Filled 2015-09-09 (×5): qty 50

## 2015-09-09 NOTE — Progress Notes (Signed)
Dr. Anselm Jungling notified patient MRSA PCR Positive. Order set for positive MRSA PCR ordered per Dr. Anselm Jungling.

## 2015-09-09 NOTE — Care Management Important Message (Signed)
Important Message  Patient Details  Name: MONE VALERO MRN: [id number] Date of Birth: [birth year]   Medicare Important Message Given:  Yes    Juliann Pulse A Gwenda Heiner 09/09/2015, 12:10 PM

## 2015-09-09 NOTE — Progress Notes (Signed)
PT Cancellation Note  Patient Details Name: Susan Alexander MRN: [id number] DOB: 1918-04-27   Cancelled Treatment:    Reason Eval/Treat Not Completed: Other (comment). Treatment attempted. Pt sleeping soundly and family wishes pt to continue to rest noting she had a room full of people most of the day. Family requests PT re attempt tomorrow.    [place], [place] 09/09/2015, 3:28 PM

## 2015-09-09 NOTE — Progress Notes (Signed)
Pharmacy Antibiotic Note  Susan Alexander is a 82 y.o. female admitted on 09/07/2015 with pneumonia.  Pharmacy has been consulted for Vancomycin and Zosyn  dosing.  Plan: Will give Vancomycin 750 mg IV x 1 then will start Vancomycin 750 mg IV q24 hours ~7 hours post dose. Will order vancomycin trough level prior to the 18:00 dose on 6/2. Targeting a trough level of 15-20 mcg/ml.  Zosyn: Will start Zosyn 3.375 g IV q8 hours.   Height: 5\' 2"  (157.5 cm) Weight: 129 lb 8 oz (58.741 kg) IBW/kg (Calculated) : 50.1  Temp (24hrs), Avg:97.9 F (36.6 C), Min:97.5 F (36.4 C), Max:98.2 F (36.8 C)   Recent Labs Lab 09/07/15 0807  WBC 14.5*  CREATININE 0.84  LATICACIDVEN 1.2    Estimated Creatinine Clearance: 32.4 mL/min (by C-G formula based on Cr of 0.84).    Allergies  Allergen Reactions  . Macrodantin [Nitrofurantoin] Other (See Comments)    Unknown reaction.  . Morphine And Related Rash    Antimicrobials this admission:  >>    >>   Dose adjustments this admission:   Microbiology results:  BCx: ngtd   UCx:  NG   Sputum:    MRSA PCR: pending   Thank you for allowing pharmacy to be a part of this patient's care.  Maria Coin D 09/09/2015 11:40 AM

## 2015-09-09 NOTE — Progress Notes (Signed)
[facility] at [place] NAME: Susan Alexander    MR#:  [id number]  DATE OF BIRTH:  [birth year]  SUBJECTIVE:  CHIEF COMPLAINT:   Chief Complaint  Patient presents with  . Weakness  . Code Sepsis  . Shortness of Breath     Brought with yellow sputum.     Have pneumonia, and she is requiring home oxygen.    Had episodes of tachycardia and tachypnea, and overall worsening respi status.   Requiring morphin to help respi status. Due to repeated laboured breathing- checked Xray chest- which shows worsening pneumonia.  REVIEW OF SYSTEMS:   Pt is not able to give ROS as she is drowsy after getting morphin injection. ROS  DRUG ALLERGIES:   Allergies  Allergen Reactions  . Macrodantin [Nitrofurantoin] Other (See Comments)    Unknown reaction.  . Morphine And Related Rash    VITALS:  Blood pressure 173/89, pulse 121, temperature 99.1 F (37.3 C), temperature source Oral, resp. rate 23, height 5\' 2"  (1.575 m), weight 58.741 kg (129 lb 8 oz), SpO2 87 %.  PHYSICAL EXAMINATION:  GENERAL:  82 y.o.-year-old patient lying in the bed , drowsy and no respi distress currently. EYES: Pupils equal, round, reactive to light . No scleral icterus.  HEENT: Head atraumatic, normocephalic. Oropharynx and nasopharynx clear.  NECK:  Supple, no jugular venous distention. No thyroid enlargement, no tenderness.  LUNGS: decreased breath sounds bilaterally, no wheezing, some crepitation. No use of accessory muscles of respiration. On supplemental oxygen. CARDIOVASCULAR: S1, S2 normal. systolic murmurs, no rubs, or gallops.  ABDOMEN: Soft, nontender, nondistended. Bowel sounds present. No organomegaly or mass.  EXTREMITIES: No pedal edema, cyanosis, or clubbing.  NEUROLOGIC: Pt is drowsy, and moves limbs to stimuli, goes back to sleep easily.  PSYCHIATRIC: The patient is drowsy.  SKIN: No obvious rash, lesion, or ulcer.   Physical Exam LABORATORY PANEL:    CBC  Recent Labs Lab 09/09/15 1143  WBC 11.2*  HGB 11.1*  HCT 33.3*  PLT 154   ------------------------------------------------------------------------------------------------------------------  Chemistries   Recent Labs Lab 09/07/15 0807 09/09/15 1143  NA 134* 132*  K 4.1 4.6  CL 96* 100*  CO2 30 27  GLUCOSE 122* 130*  BUN 14 15  CREATININE 0.84 0.69  CALCIUM 9.6 9.1  AST 40  --   ALT 27  --   ALKPHOS 64  --   BILITOT 1.1  --    ------------------------------------------------------------------------------------------------------------------  Cardiac Enzymes No results for input(s): TROPONINI in the last 168 hours. ------------------------------------------------------------------------------------------------------------------  RADIOLOGY:  Dg Chest Port 1 View  09/09/2015  CLINICAL DATA:  Dementia.  Cough and pneumonia. EXAM: PORTABLE CHEST 1 VIEW COMPARISON:  09/07/2015 FINDINGS: Heart size is enlarged. Aortic atherosclerosis. The lung volumes are low. Bilateral, multi focal airspace opacities are identified. There is worsening aeration of both upper lobes. IMPRESSION: 1. Worsening aeration to both upper lobes compatible with multifocal pneumonia. Electronically Signed   By: Kerby Moors M.D.   On: 09/09/2015 10:27    ASSESSMENT AND PLAN:   Active Problems:   Sepsis ([place])   Susan Alexander is a 82 y.o. female with a known history of Dementia, history of breast cancer and history of COPD comes into the emergency room accompanied by her daughter with the above chief complaint. Patient is very weak fatigued and unable to give much review of systems or history. Most of the information is obtained from daughter who is at bedside. Patient has been having cough  with productive green thick mucoid sputum for last couple days was found to have low-grade fever last night and appears very weak short of breath.  1. sepsis due to bilateral multilobar pneumonia ,  community-acquired    Causing ac on ch respi failure with hypoxia.  -IV Rocephin and Zithromax- still had worsening- so upgrade to vanc+ zosyn. -Follow blood culture and WBC count -Incentive spirometer and breathing treatment around the clock - suplemental oxygen as needed. - Added morphin for laboured breathing. - Added pulmicort. - Discussed with her daughter about poor prognosis- and offered pallaitive care- she want to wait 1-2 more days for response.  2. Leukocytosis due to 1  3. Mild dementia -Patient has 24x 7 caregivers at home  4. Gen weakness _PT to see pt -Cm for d/c planing   All the records are reviewed and case discussed with Care Management/Social Workerr. Management plans discussed with the patient, family and they are in agreement.  CODE STATUS: DNR  TOTAL TIME TAKING CARE OF THIS PATIENT: 45 critical Care minutes.   Discussed with her daughter in room.  POSSIBLE D/C IN 1-2 DAYS, DEPENDING ON CLINICAL CONDITION.   Susan Alexander M.D on 09/09/2015   Between 7am to 6pm - Pager - [phone]  After 6pm go to [email or web address] - password [email or web address] [facility]  Office  [phone]  CC: Primary care physician; Rusty Aus, MD  Note: This dictation was prepared with Dragon dictation along with smaller phrase technology. Any transcriptional errors that result from this process are unintentional.

## 2015-09-10 LAB — BASIC METABOLIC PANEL
Anion gap: 7 (ref 5–15)
BUN: 23 mg/dL — ABNORMAL HIGH (ref 6–20)
CALCIUM: 9.4 mg/dL (ref 8.9–10.3)
CHLORIDE: 99 mmol/L — AB (ref 101–111)
CO2: 26 mmol/L (ref 22–32)
CREATININE: 1.34 mg/dL — AB (ref 0.44–1.00)
GFR calc non Af Amer: 33 mL/min — ABNORMAL LOW (ref >60)
GFR, EST AFRICAN AMERICAN: 38 mL/min — AB (ref >60)
GLUCOSE: 136 mg/dL — AB (ref 65–99)
Potassium: 5.1 mmol/L (ref 3.5–5.1)
Sodium: 132 mmol/L — ABNORMAL LOW (ref 135–145)

## 2015-09-10 LAB — CBC
HCT: 34.3 % — ABNORMAL LOW (ref 35.0–47.0)
Hemoglobin: 11.5 g/dL — ABNORMAL LOW (ref 12.0–16.0)
MCH: 30.9 pg (ref 26.0–34.0)
MCHC: 33.6 g/dL (ref 32.0–36.0)
MCV: 92.1 fL (ref 80.0–100.0)
Platelets: 174 10*3/uL (ref 150–440)
RBC: 3.73 MIL/uL — AB (ref 3.80–5.20)
RDW: 14.3 % (ref 11.5–14.5)
WBC: 13.7 10*3/uL — AB (ref 3.6–11.0)

## 2015-09-10 MED ORDER — LORAZEPAM 2 MG/ML IJ SOLN
INTRAMUSCULAR | Status: AC
  Filled 2015-09-10: qty 1

## 2015-09-10 MED ORDER — MORPHINE SULFATE (PF) 2 MG/ML IV SOLN: 0.5000 mg | INTRAVENOUS | Status: DC | PRN

## 2015-09-10 MED ORDER — LORAZEPAM 0.5 MG PO TABS: 0.5000 mg | ORAL_TABLET | ORAL | Status: AC | PRN

## 2015-09-10 MED ORDER — LORAZEPAM 2 MG/ML IJ SOLN
0.5000 mg | INTRAMUSCULAR | Status: DC | PRN
  Administered 2015-09-10 (×2): 0.5 mg via INTRAVENOUS
  Filled 2015-09-10: qty 1

## 2015-09-10 MED ORDER — MORPHINE SULFATE 20 MG/5ML PO SOLN: 5.0000 mg | ORAL | Status: AC | PRN

## 2015-09-10 MED ORDER — MORPHINE SULFATE (PF) 2 MG/ML IV SOLN: 1.0000 mg | INTRAVENOUS | Status: DC | PRN

## 2015-09-10 MED ORDER — BUDESONIDE 0.25 MG/2ML IN SUSP
RESPIRATORY_TRACT | Status: DC
Start: 2015-09-10 — End: 2015-09-10
  Filled 2015-09-10: qty 2

## 2015-09-10 MED ORDER — VANCOMYCIN HCL IN DEXTROSE 750-5 MG/150ML-% IV SOLN: 750.0000 mg | INTRAVENOUS | Status: DC

## 2015-09-10 MED ORDER — LORAZEPAM 0.5 MG PO TABS
0.5000 mg | ORAL_TABLET | ORAL | Status: DC | PRN
  Administered 2015-09-10: 20:00:00 0.5 mg via ORAL
  Filled 2015-09-10: qty 1

## 2015-09-10 NOTE — Discharge Summary (Signed)
[facility] at [place] NAME: Susan Alexander    MR#:  [id number]  DATE OF BIRTH:  1918-08-23  DATE OF ADMISSION:  09/07/2015 ADMITTING PHYSICIAN: Fritzi Mandes, MD  DATE OF DISCHARGE: 09/10/15  PRIMARY CARE PHYSICIAN: Rusty Aus, MD    ADMISSION DIAGNOSIS:  Community acquired pneumonia [J18.9] Hypoxia [R09.02] Sepsis, due to unspecified organism ([place]) [A41.9]  DISCHARGE DIAGNOSIS:  Sepsis due to pneumonia Multifocal severe Pneumonia Acute respiratory failure with hypoxia  SECONDARY DIAGNOSIS:   Past Medical History  Diagnosis Date  . COPD (chronic obstructive pulmonary disease) ([place])   . Dementia   . Cancer [facility])     breast cancer    HOSPITAL COURSE:  Susan Alexander is a 82 y.o. female with a known history of Dementia, history of breast cancer and history of COPD comes into the emergency room accompanied by her daughter with the above chief complaint. Patient is very weak fatigued and unable to give much review of systems or history. Most of the information is obtained from daughter who is at bedside. Patient has been having cough with productive green thick mucoid sputum for last couple days was found to have low-grade fever last night and appears very weak short of breath.  1. sepsis due to bilateral multilobar pneumonia , community-acquired  Causing ac on ch respi failure with hypoxia. Pt was onbroad spectrum IV antibiotics. She continued to struggle to breath and repeat CXR showed worsening pneumonia -spoke with 2 daughters in the room and they are requesting comfort measures and Hospice to come home -spoke with CM and Santiago Glad from hospice  2. Leukocytosis due to 1  3. Mild dementia -Patient has 24x 7 caregivers at home  Will d/c later today to home with hospice  CONSULTS OBTAINED:     DRUG ALLERGIES:   Allergies  Allergen Reactions  . Macrodantin [Nitrofurantoin] Other (See Comments)    Unknown reaction.   . Morphine And Related Rash    DISCHARGE MEDICATIONS:   Current Discharge Medication List    START taking these medications   Details  LORazepam (ATIVAN) 0.5 MG tablet Take 1 tablet (0.5 mg total) by mouth every 2 (two) hours as needed for anxiety. Qty: 30 tablet, Refills: 0    morphine 20 MG/5ML solution Take 1.3 mLs (5.2 mg total) by mouth every hour as needed for pain. Qty: 30 mL, Refills: 0      CONTINUE these medications which have NOT CHANGED   Details  acetaminophen (TYLENOL) 500 MG tablet Take 1,000 mg by mouth every 6 (six) hours as needed for mild pain or moderate pain.    ondansetron (ZOFRAN) 4 MG tablet Take 4 mg by mouth every 8 (eight) hours as needed for nausea or vomiting.      STOP taking these medications     albuterol (PROVENTIL) (2.5 MG/3ML) 0.083% nebulizer solution      ALPRAZolam (XANAX) 0.5 MG tablet      hydrOXYzine (ATARAX/VISTARIL) 25 MG tablet      methenamine (MANDELAMINE) 1 g tablet      pantoprazole (PROTONIX) 40 MG tablet      traMADol (ULTRAM) 50 MG tablet         If you experience worsening of your admission symptoms, develop shortness of breath, life threatening emergency, suicidal or homicidal thoughts you must seek medical attention immediately by calling 911 or calling your MD immediately  if symptoms less severe.  You Must read complete instructions/literature along with all  the possible adverse reactions/side effects for all the Medicines you take and that have been prescribed to you. Take any new Medicines after you have completely understood and accept all the possible adverse reactions/side effects.   Please note  You were cared for by a hospitalist during your hospital stay. If you have any questions about your discharge medications or the care you received while you were in the hospital after you are discharged, you can call the unit and asked to speak with the hospitalist on call if the hospitalist that took care of you is  not available. Once you are discharged, your primary care physician will handle any further medical issues. Please note that NO REFILLS for any discharge medications will be authorized once you are discharged, as it is imperative that you return to your primary care physician (or establish a relationship with a primary care physician if you do not have one) for your aftercare needs so that they can reassess your need for medications and monitor your lab values. Today   SUBJECTIVE  Unable to obtain any iinfo. Pt is on comfort measures   VITAL SIGNS:  Blood pressure 118/74, pulse 103, temperature 98.5 F (36.9 C), temperature source Oral, resp. rate 18, height 5\' 2"  (1.575 m), weight 58.741 kg (129 lb 8 oz), SpO2 95 %.  I/O:  No intake or output data in the 24 hours ending 09/10/15 1055  PHYSICAL EXAMINATION:  GENERAL:  82 y.o.-year-old patient lying in the bed with no acute distress. Thin, cachectic EYES: Pupils equal, round, reactive to light and accommodation. No scleral icterus. Extraocular muscles intact.  HEENT: Head atraumatic, normocephalic. Oropharynx and nasopharynx clear.  NECK:  Supple, no jugular venous distention. No thyroid enlargement, no tenderness.  LUNGS: coarsebreath sounds bilaterally, no wheezing, rales,rhonchi or crepitation. No use of accessory muscles of respiration.  CARDIOVASCULAR: S1, S2 normal. No murmurs, rubs, or gallops.  ABDOMEN: Soft, non-tender, non-distended. Bowel sounds present. No organomegaly or mass.  EXTREMITIES: No pedal edema, cyanosis, or clubbing.  NEUROLOGIC: unable to assess PSYCHIATRIC: lethragiv on comfort  Measures SKIN: No obvious rash, lesion, or ulcer.   DATA REVIEW:   CBC   Recent Labs Lab 09/10/15 0617  WBC 13.7*  HGB 11.5*  HCT 34.3*  PLT 174    Chemistries   Recent Labs Lab 09/07/15 0807  09/10/15 0617  NA 134*  < > 132*  K 4.1  < > 5.1  CL 96*  < > 99*  CO2 30  < > 26  GLUCOSE 122*  < > 136*  BUN 14  < > 23*   CREATININE 0.84  < > 1.34*  CALCIUM 9.6  < > 9.4  AST 40  --   --   ALT 27  --   --   ALKPHOS 64  --   --   BILITOT 1.1  --   --   < > = values in this interval not displayed.  Microbiology Results   Recent Results (from the past 240 hour(s))  Culture, blood (Routine x 2)     Status: None (Preliminary result)   Collection Time: 09/07/15  8:07 AM  Result Value Ref Range Status   Specimen Description BLOOD RIGHT HAND  Final   Special Requests BOTTLES DRAWN AEROBIC AND ANAEROBIC  3CC  Final   Culture NO GROWTH 3 DAYS  Final   Report Status PENDING  Incomplete  Urine culture     Status: None   Collection Time: 09/07/15  8:08  AM  Result Value Ref Range Status   Specimen Description URINE, RANDOM  Final   Special Requests NONE  Final   Culture NO GROWTH Performed at [facility]   Final   Report Status 09/08/2015 FINAL  Final  Culture, blood (Routine x 2)     Status: None (Preliminary result)   Collection Time: 09/07/15  8:23 AM  Result Value Ref Range Status   Specimen Description BLOOD LEFT ANTECUBITAL  Final   Special Requests BOTTLES DRAWN AEROBIC AND ANAEROBIC  6CC  Final   Culture NO GROWTH 3 DAYS  Final   Report Status PENDING  Incomplete  MRSA PCR Screening     Status: Abnormal   Collection Time: 09/09/15 12:08 PM  Result Value Ref Range Status   MRSA by PCR POSITIVE (A) NEGATIVE Final    Comment:        The GeneXpert MRSA Assay (FDA approved for NASAL specimens only), is one component of a comprehensive MRSA colonization surveillance program. It is not intended to diagnose MRSA infection nor to guide or monitor treatment for MRSA infections. CRITICAL RESULT CALLED TO, READ BACK BY AND VERIFIED WITH: CALLED SKYLAR STONE 09/09/15 @ 1330    [place]:  Dg Chest Port 1 View  09/09/2015  CLINICAL DATA:  Dementia.  Cough and pneumonia. EXAM: PORTABLE CHEST 1 VIEW COMPARISON:  09/07/2015 FINDINGS: Heart size is enlarged. Aortic atherosclerosis. The  lung volumes are low. Bilateral, multi focal airspace opacities are identified. There is worsening aeration of both upper lobes. IMPRESSION: 1. Worsening aeration to both upper lobes compatible with multifocal pneumonia. Electronically Signed   By: Kerby Moors M.D.   On: 09/09/2015 10:27     Management plans discussed with the patient, family and they are in agreement.  CODE STATUS:     Code Status Orders        Start     Ordered   09/07/15 1315  Do not attempt resuscitation (DNR)   Continuous    Question Answer Comment  In the event of cardiac or respiratory ARREST Do not call a "code blue"   In the event of cardiac or respiratory ARREST Do not perform Intubation, CPR, defibrillation or ACLS   In the event of cardiac or respiratory ARREST Use medication by any route, position, wound care, and other measures to relive pain and suffering. May use oxygen, suction and manual treatment of airway obstruction as needed for comfort.      09/07/15 1314    Code Status History    Date Active Date Inactive Code Status Order ID Comments User Context   This patient has a current code status but no historical code status.    Advance Directive Documentation        Most Recent Value   Type of Advance Directive  Healthcare Power of Attorney   Pre-existing out of facility DNR order (yellow form or pink MOST form)     "MOST" Form in Place?        TOTAL TIME TAKING CARE OF THIS PATIENT: 40 minutes.    Susan Alexander M.D on 09/10/2015 at 10:55 AM  Between 7am to 6pm - Pager - [phone] After 6pm go to [email or web address] - password [email or web address] [facility]  Office  [phone]  CC: Primary care physician; Rusty Aus, MD

## 2015-09-10 NOTE — Progress Notes (Signed)
Pharmacy Antibiotic Note  Susan Alexander is a 82 y.o. female admitted on 09/07/2015 with pneumonia.  Pharmacy has been consulted for Vancomycin and Zosyn  Dosing. Patient was initially prescribed ceftriaxone + azithromycin but continued to be febrile on day #3 of antibiotic therapy.  This is day #4 of antibiotic therapy, but day #2 of vancomycin & piperacillin/tazobactam  Patient was initially started on a vancomycin 750 mg IV q24h regimen. However, SCr increased from yesterday (up to 1.3 from 0.7).   Plan: Change vancomycin to 750 mg IV q36 hours based on kinetics with current renal function. Ke: 0.023 Half-life: 30 hrs Vd: 41 L Goal vancomycin trough = 15-20 mcg/mL Will check a vancomycin level prior to next dose to ensure patient is not supratherapeutic even though she will not be at steady state.  Continue piperacillin/tazobactam 3.375 g IV q8h  Height: 5\' 2"  (157.5 cm) Weight: 129 lb 8 oz (58.741 kg) IBW/kg (Calculated) : 50.1  Temp (24hrs), Avg:99.1 F (37.3 C), Min:98.2 F (36.8 C), Max:100.6 F (38.1 C)   Recent Labs Lab 09/07/15 0807 09/09/15 1143 09/10/15 0617  WBC 14.5* 11.2* 13.7*  CREATININE 0.84 0.69 1.34*  LATICACIDVEN 1.2  --   --     Estimated Creatinine Clearance: 20.3 mL/min (by C-G formula based on Cr of 1.34).    Allergies  Allergen Reactions  . Macrodantin [Nitrofurantoin] Other (See Comments)    Unknown reaction.  . Morphine And Related Rash   Antimicrobials this admission: Vancomycin 5/30 >>  Pip/tazo 5/30  >>  CTX & azithromycin 5/28 >> 5/30  Dose adjustments this admission: 5/31 Changed vancomycin frequency from 750 mg IV q24h to 750 mg IV q36h  Microbiology results:  BCx: no growth 3 days  UCx:  negative   Sputum:    MRSA PCR: positive  Thank you for allowing pharmacy to be a part of this patient's care.  Lenis Noon, PharmD Clinical Pharmacist 09/10/2015 9:01 AM

## 2015-09-10 NOTE — Progress Notes (Signed)
New referral for [facility] services at home following dishcarge received from [place] on a 82 year old woman admitted to [facility] on 5/28 for treatment of sepsis secondary to PNA. She has been treated with IV antibiotics and has continued to decline. Family has chosen to have her discharge home with hospice services. Patient seen lying in bed eyes closed, PRN IV lorazepam given prior to visit, patient appeared comfortable, no nonverbal s/s of pain or increased work of breathing. Family reports she has been able to communicate/respond to  Them off and on.  Writer met in the family room with Susan Alexander's daughter Susan Alexander, son in law Susan Alexander and daughter Susan Alexander to initiate education regarding hospice services, philosphy and team approach to care with good understanding voiced. Questions answered. Patient currently has a hospital bed in place in her home. She is currently on 4 liters of oxygen via nasal cannula and will require oxygen to be in place at home prior to discharge. Writer to order. [name] to be the contact for delivery. All patient information faxed to referral intake. Patient will require EMS transport. CMRN [name] aware.  Hospital care team and family in agreement with plan for discharge home today via EMS with signed portable DNR and prescriptions for liquid morphine and lorazepam. Thank you for the opportunity to be involved ihn the care of this patient. Flo Shanks RN, BSN, [place] [facility] [place], [facility] [phone] c

## 2015-09-10 NOTE — Progress Notes (Signed)
EMS arrived to take pt home via ambulance.  VSS.  Family present at discharge. Dorna Bloom RN

## 2015-09-10 NOTE — Care Management (Signed)
Spoke with Dr. Fritzi Mandes. States that the family of Ms. Sassi would like to take her home with hospice services in place, Hospice Agency list discussed with  Jones Broom (daughter that Ms Prestage lives with) and other daughter. [place]. Flo Shanks, RN representative for [facility] updated.  Shelbie Ammons RN MSN CCM Care Management [phone]

## 2015-09-10 NOTE — Discharge Instructions (Signed)
Hospice to follow at home 

## 2015-09-10 NOTE — Progress Notes (Signed)
AT approximately 9pm this shift, pt became increasingly agitated and her work of breathing increased.  Her BP and HR were elevated and her O2 sats were around 87%/  Dr Claria Dice approved a one time dose of Ativan IV 0.5 mg.  Pt seems exhausted, but is resting peacefully since the Ativan dose.  Her breathing is still labored but she is quiet.

## 2015-09-10 NOTE — Care Management (Signed)
Discharge to home with hospice services in place per Dr. Posey Pronto. Will be followed by [facility]. Transportation will be arranged through [employer] unit. Shelbie Ammons RN MSN CCM Care Management [phone]

## 2015-09-12 LAB — CULTURE, BLOOD (ROUTINE X 2)
Culture: NO GROWTH
Culture: NO GROWTH

## 2015-10-11 DEATH — deceased

## 2017-06-24 IMAGING — DX DG CHEST 1V PORT
1 series · 1 of 1 positions shown · non-contrast
Comparison: None.

CLINICAL DATA: Fever and increased weakness.

EXAM:
PORTABLE CHEST 1 VIEW

[chest ap]
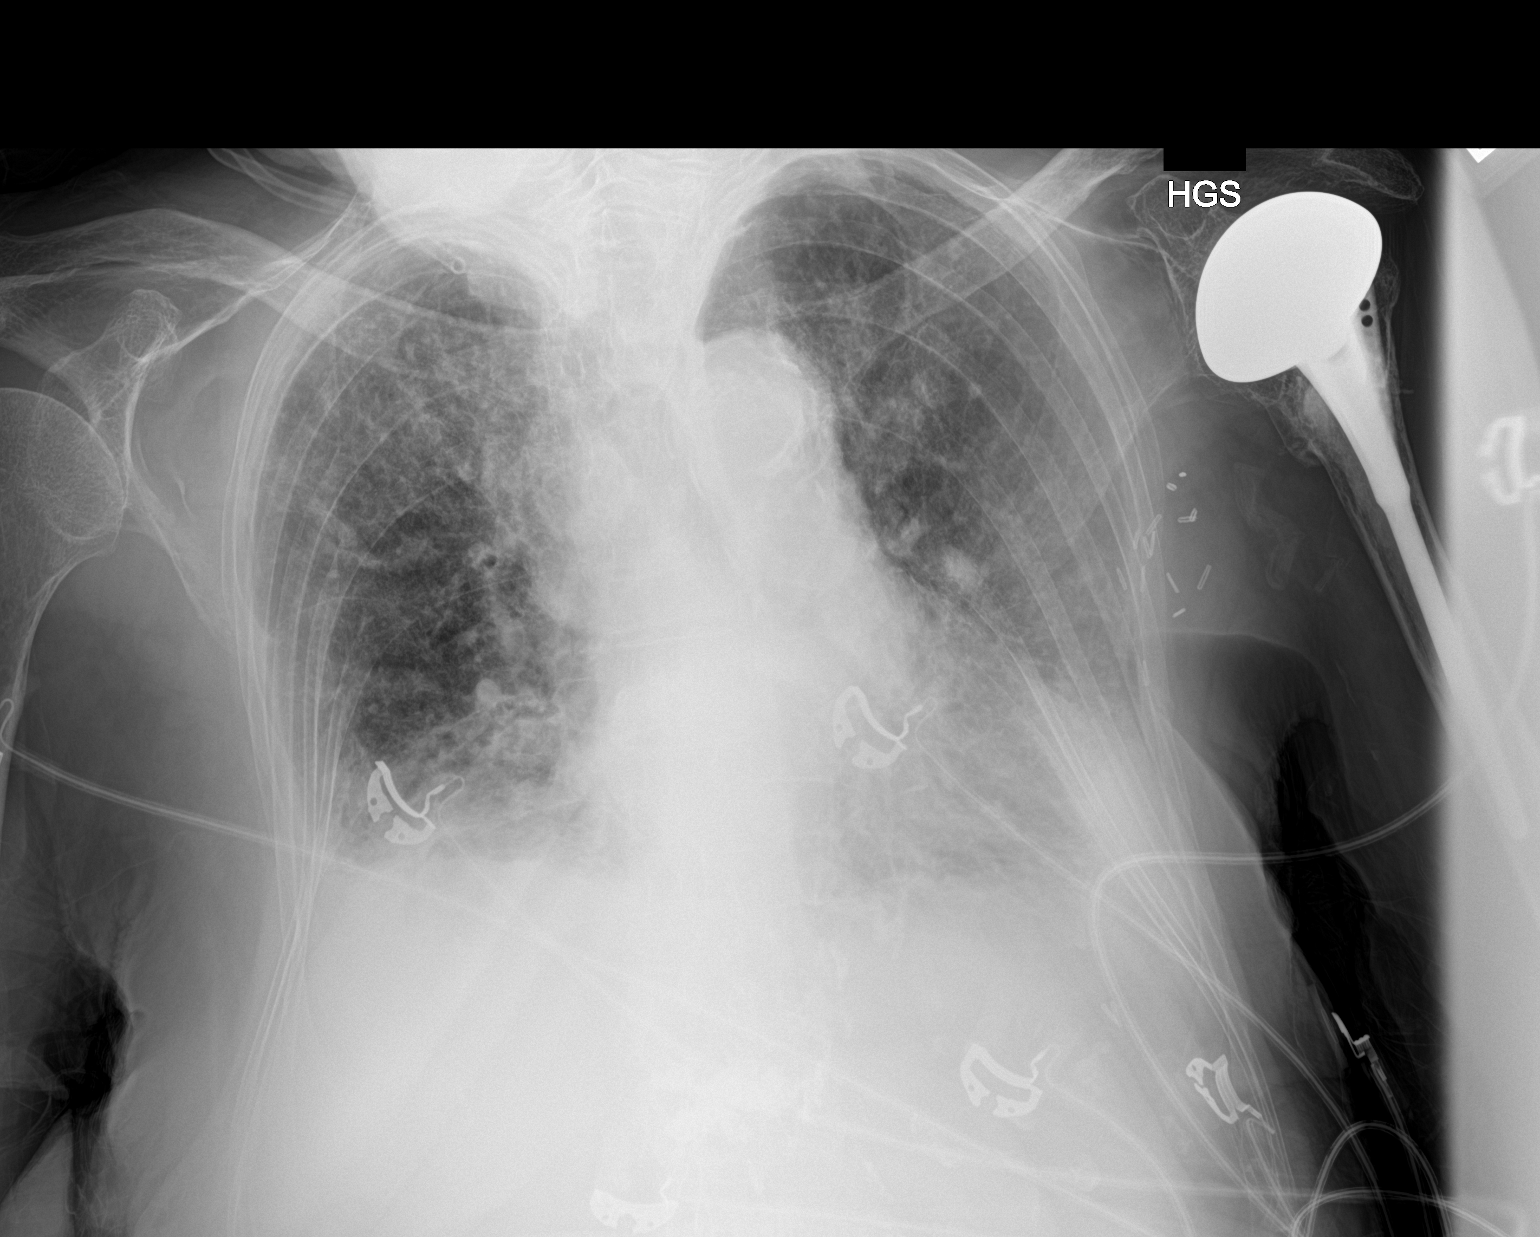

[1 of 1 positions shown; findings below may reference images not displayed]

FINDINGS: Grossly normal sized heart. Patchy increased density at both lung
bases, more confluent in dense at the left lateral lung base.
Diffusely prominent interstitial markings. Left axillary surgical
clips and left humeral head prosthesis. Diffuse osteopenia.
IMPRESSION: 1. Bibasilar atelectasis and possible pneumonia.
2. Chronic interstitial lung disease.

## 2017-06-26 IMAGING — DX DG CHEST 1V PORT
1 series · 1 of 1 positions shown · non-contrast
Comparison: 09/07/2015

CLINICAL DATA: Dementia.  Cough and pneumonia.

EXAM:
PORTABLE CHEST 1 VIEW

[chest ap]
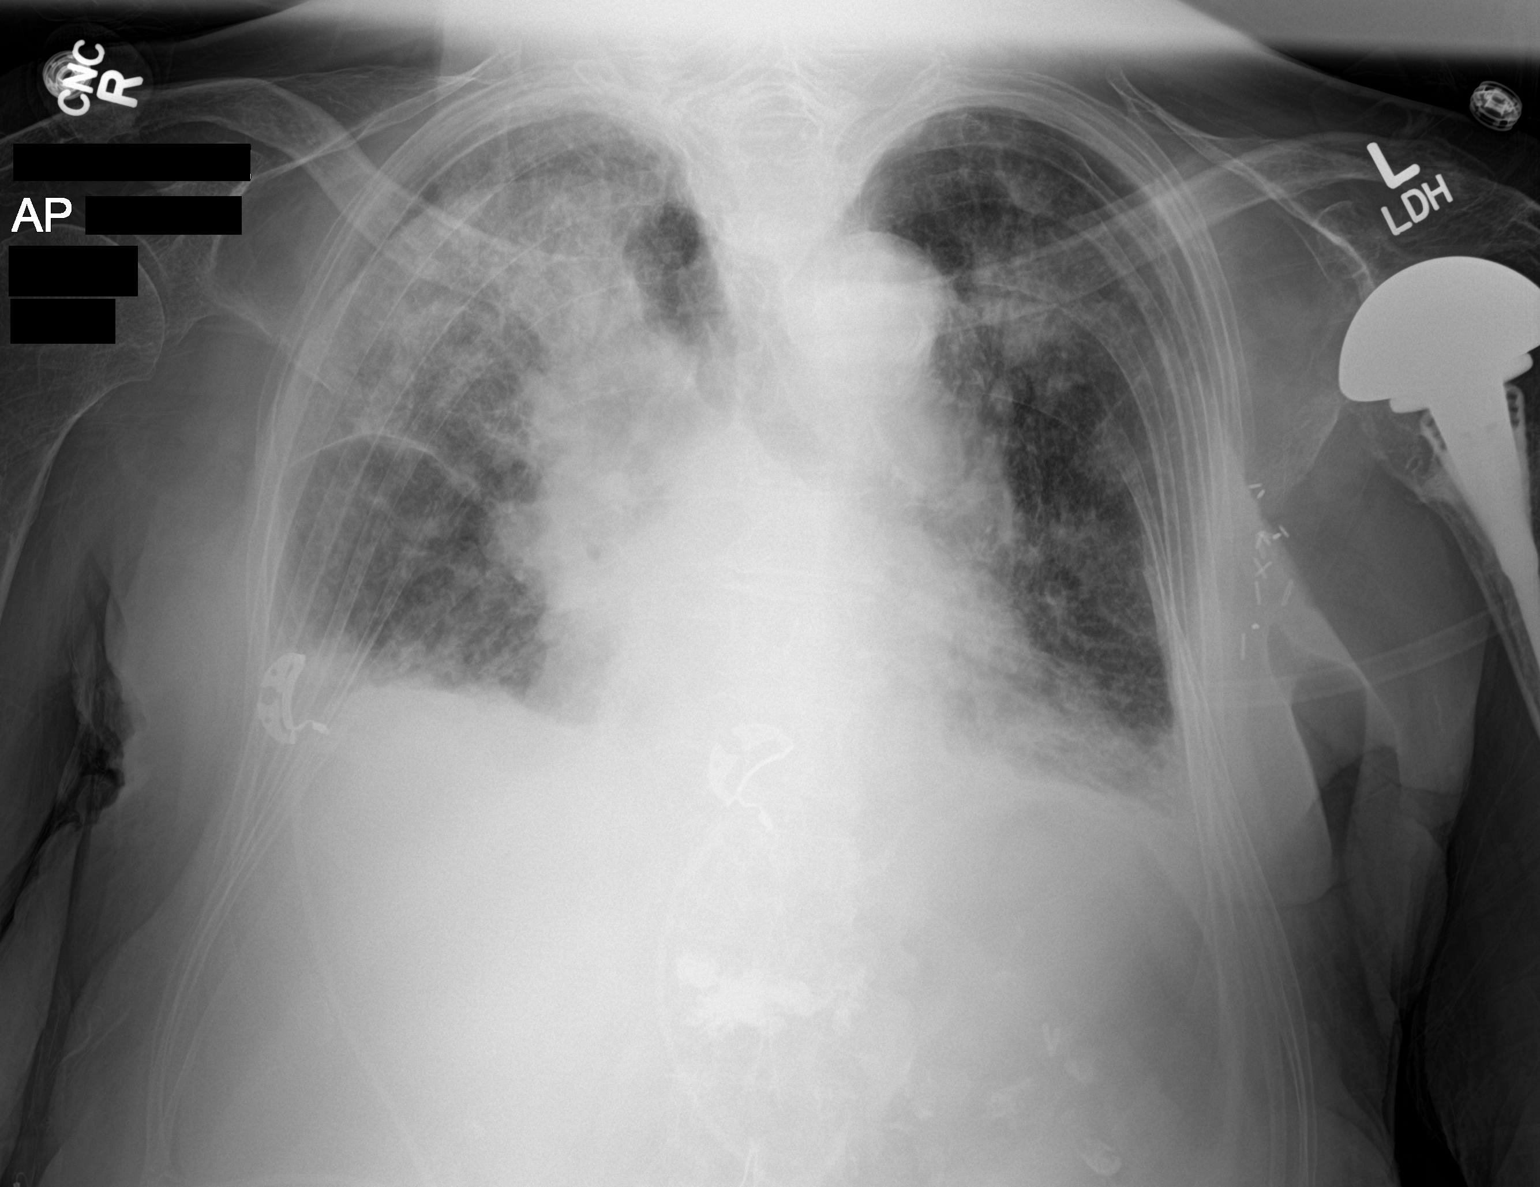

[1 of 1 positions shown; findings below may reference images not displayed]

FINDINGS: Heart size is enlarged. Aortic atherosclerosis. The lung volumes are
low. Bilateral, multi focal airspace opacities are identified. There
is worsening aeration of both upper lobes.
IMPRESSION: 1. Worsening aeration to both upper lobes compatible with multifocal
pneumonia.
